# Patient Record
Sex: Male | Born: 2000 | Race: Black or African American | Hispanic: No | Marital: Single | Smoking: Never smoker
Health system: Southern US, Community
[De-identification: ages and names within clinical notes are randomized; demographics above are authoritative.]

## PROBLEM LIST (undated history)

## (undated) DIAGNOSIS — Q150 Congenital glaucoma: Secondary | ICD-10-CM

---
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
## 2022-07-27 IMAGING — DX DG SHOULDER 2+V*R*
3 series · 3 of 3 positions shown · non-contrast
Comparison: None.

CLINICAL DATA: Shoulder pain

EXAM:
RIGHT SHOULDER - 2+ VIEW

[view not recorded]
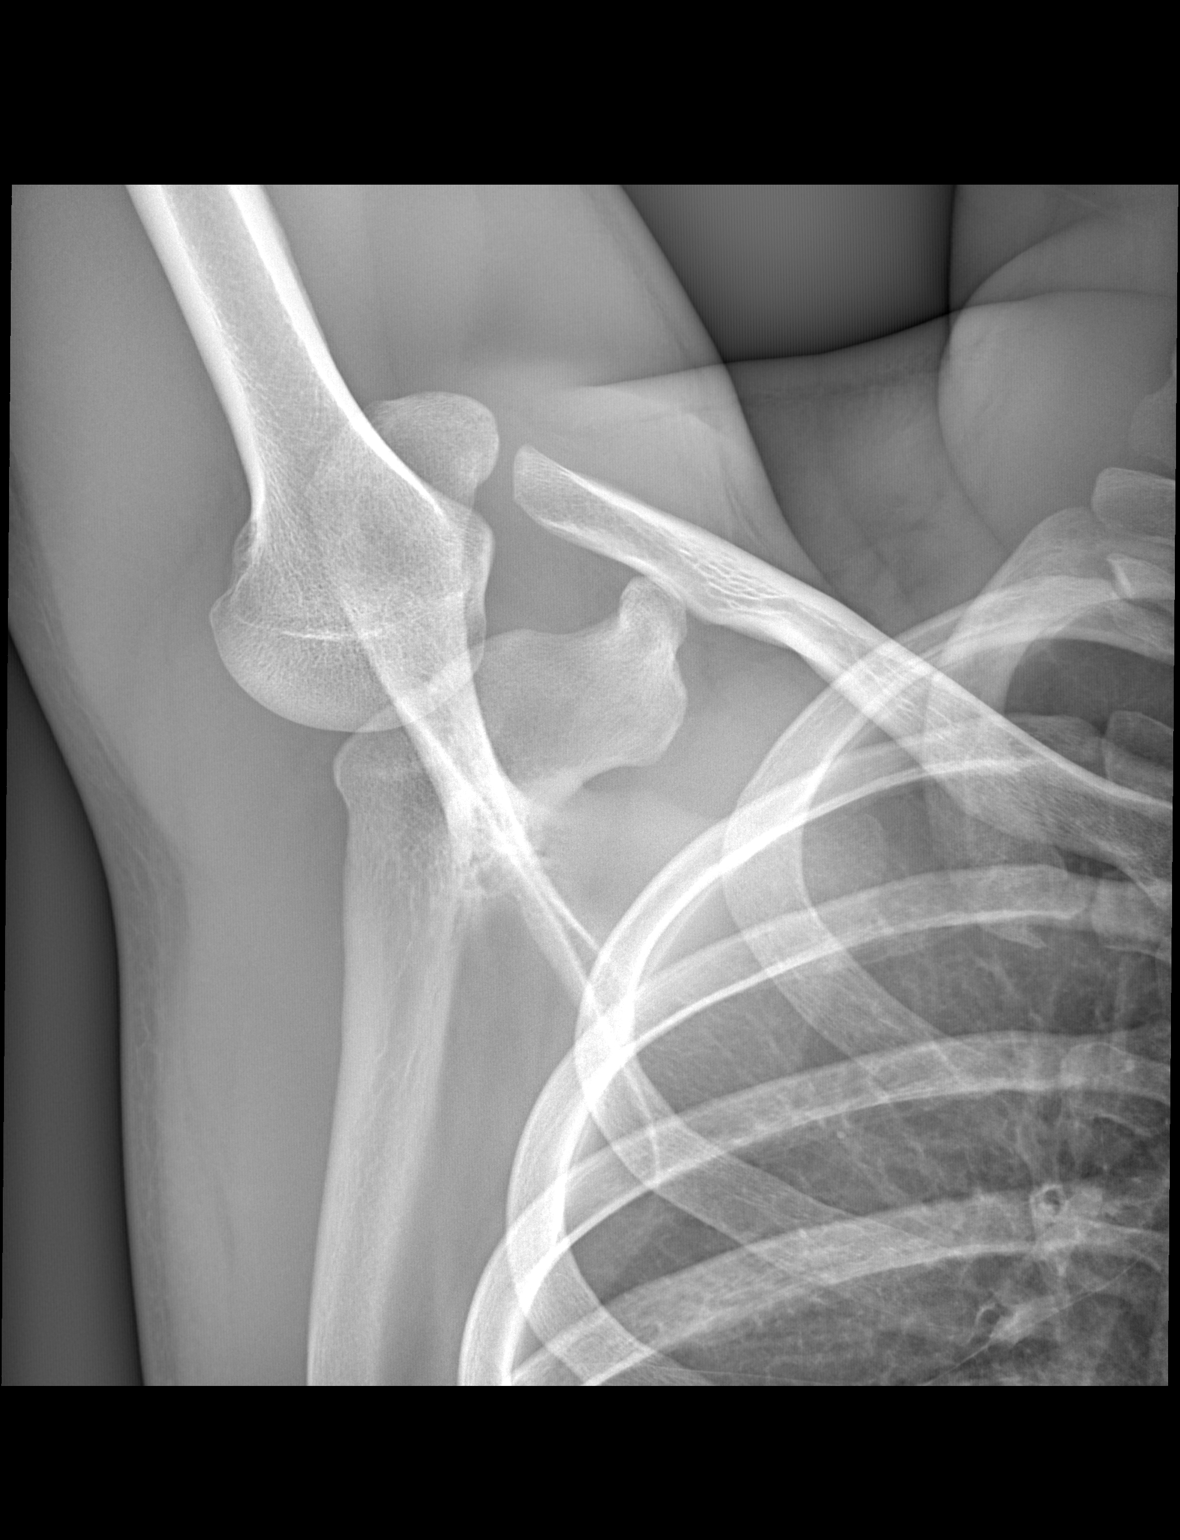

[AP (1 of 2)]
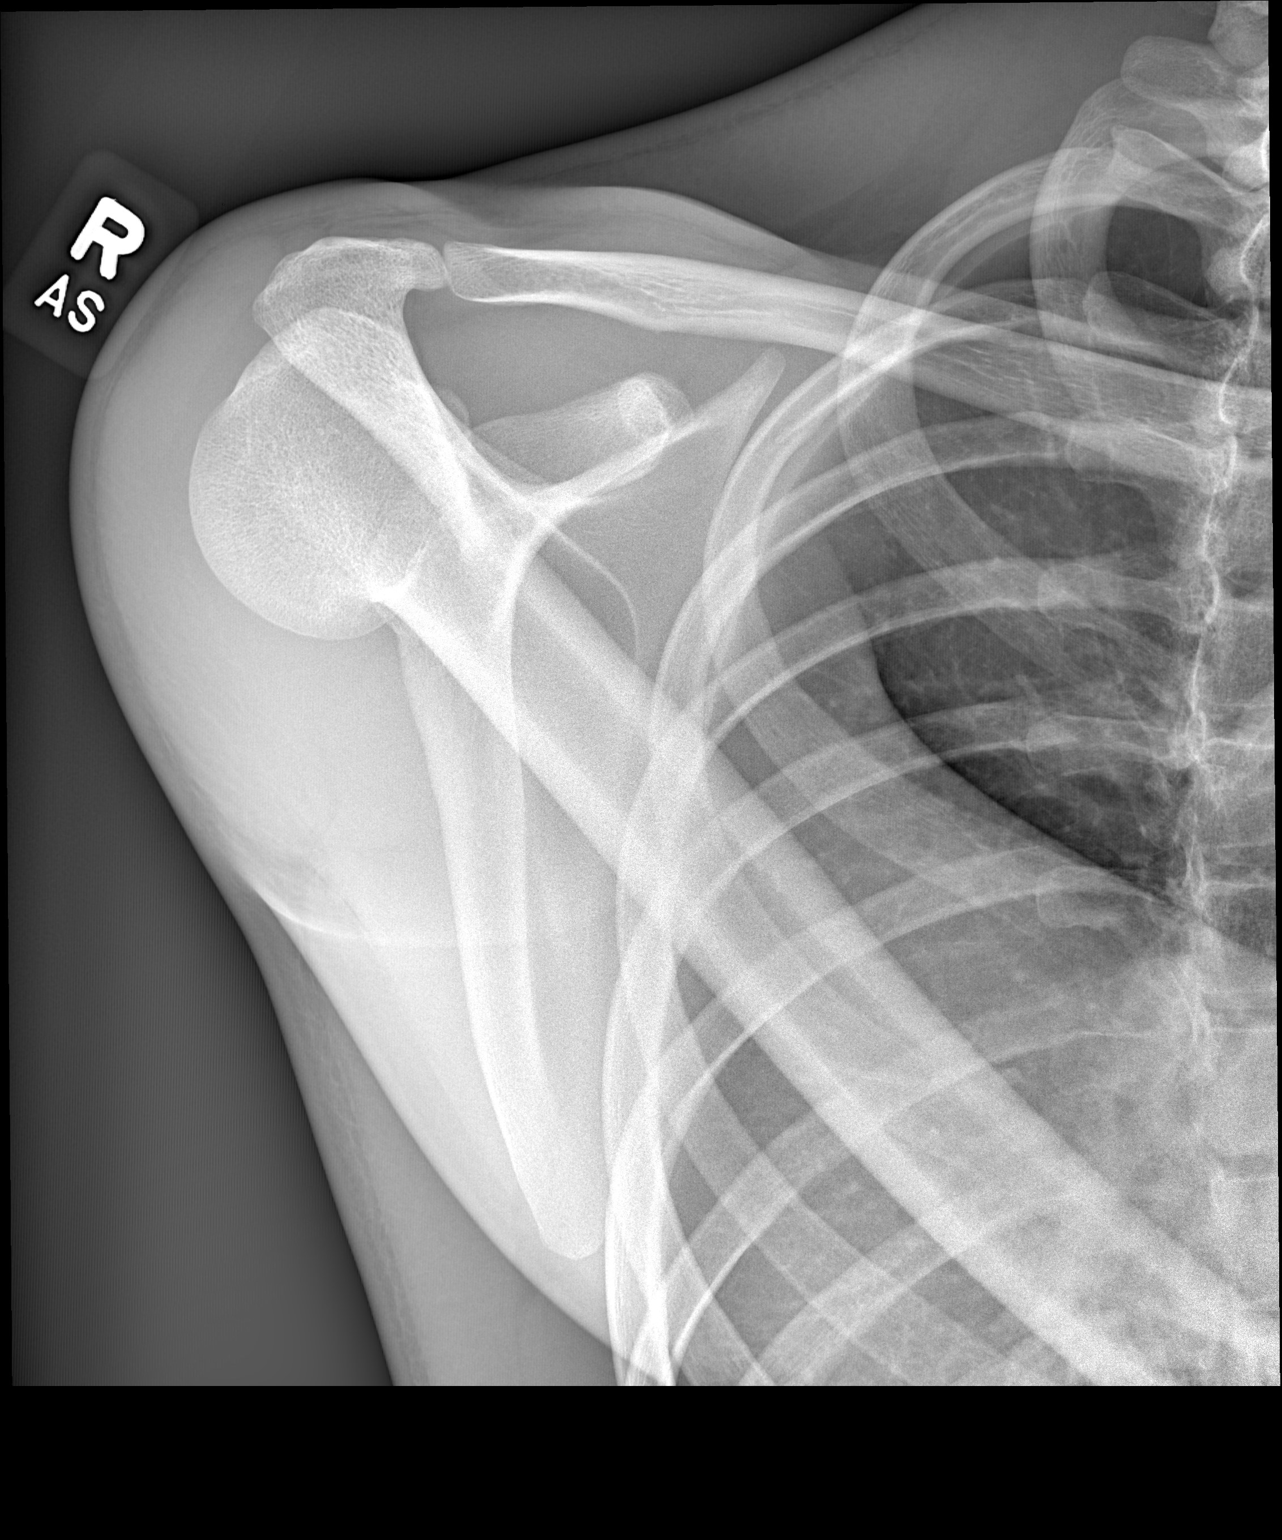

[AP (2 of 2)]
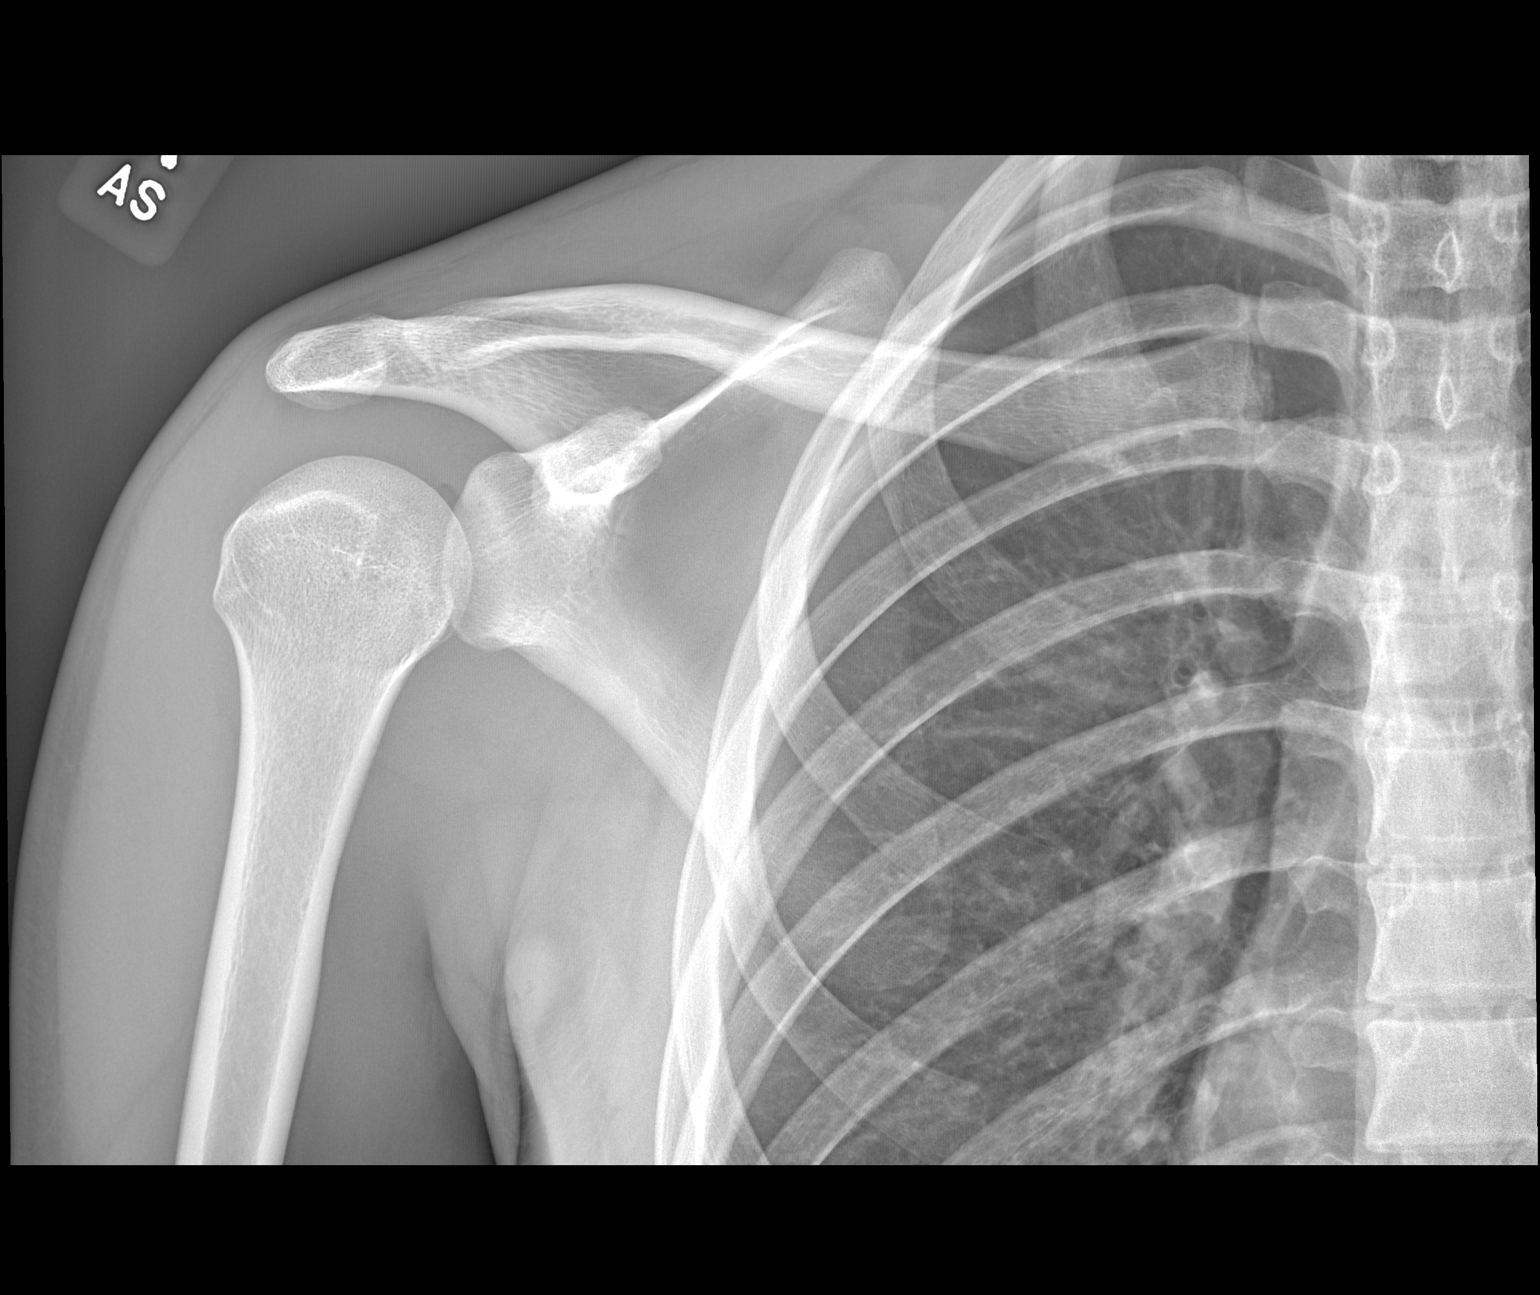

[3 of 3 positions shown; findings below may reference images not displayed]

FINDINGS: There is no evidence of fracture or dislocation. There is no
evidence of arthropathy or other focal bone abnormality. Soft
tissues are unremarkable.
IMPRESSION: Negative.
